# Patient Record
Sex: Male | Born: 2005
Health system: Southern US, Community
[De-identification: ages and names within clinical notes are randomized; demographics above are authoritative.]

## PROBLEM LIST (undated history)

## (undated) DIAGNOSIS — Z889 Allergy status to unspecified drugs, medicaments and biological substances status: Secondary | ICD-10-CM

## (undated) HISTORY — PX: TONSILLECTOMY: SUR1361

## (undated) HISTORY — PX: TYMPANOSTOMY TUBE PLACEMENT: SHX32

---
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
## 2023-03-22 IMAGING — CR DG CHEST 2V
2 series · 2 of 2 positions shown · non-contrast
Comparison: 05/29/2019

CLINICAL DATA: cough, fever, SOB with walking.

EXAM:
CHEST - 2 VIEW

[chest pa]
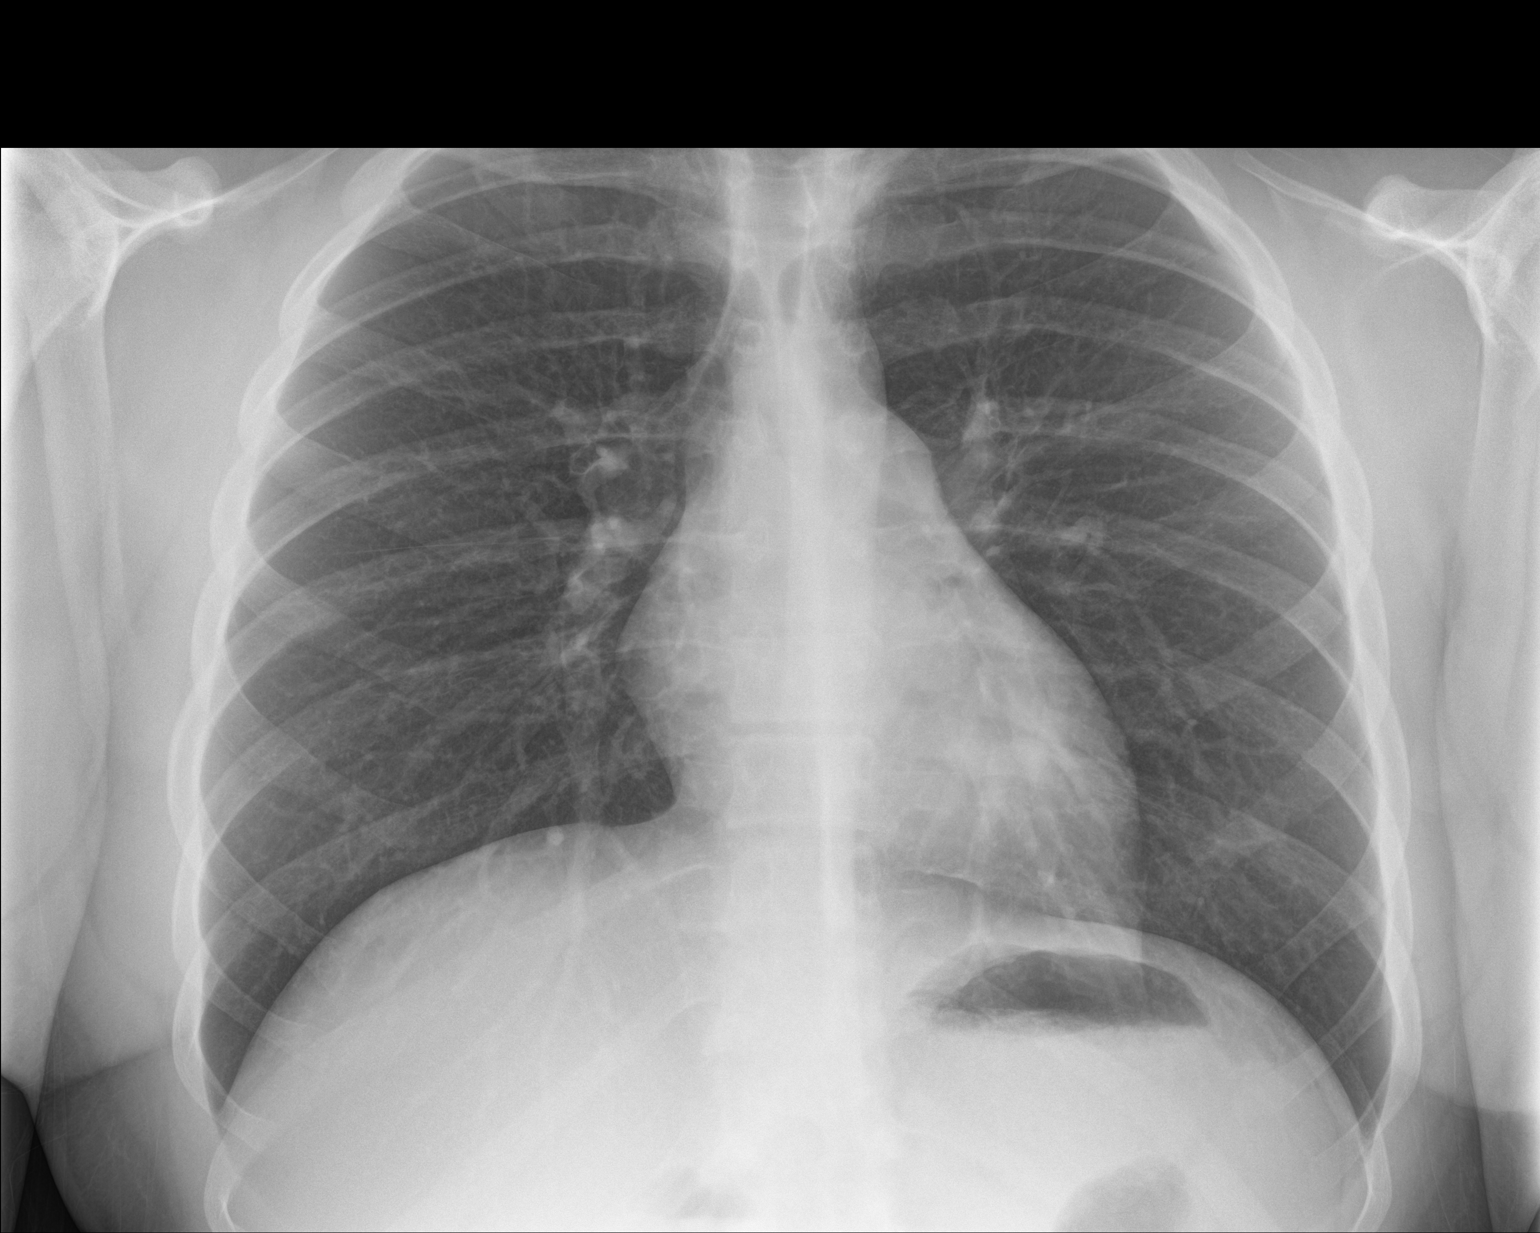

[chest lat]
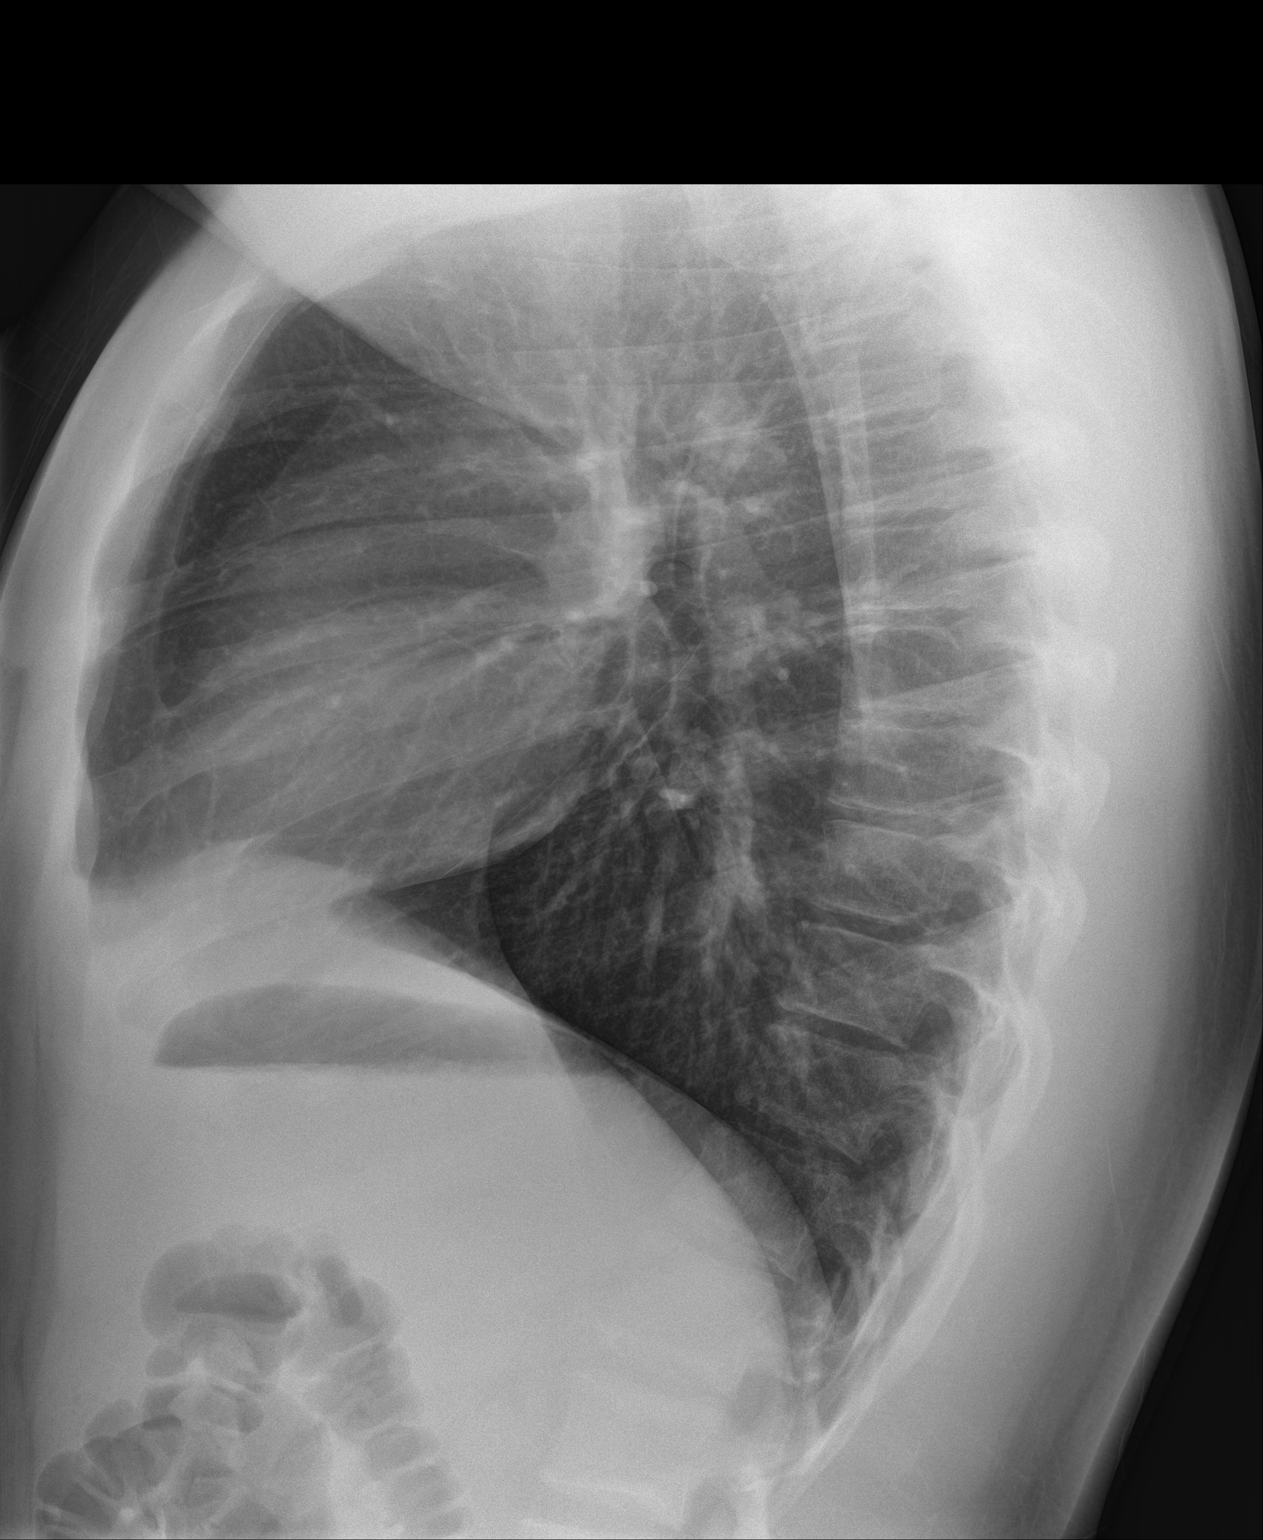

[2 of 2 positions shown; findings below may reference images not displayed]

FINDINGS: The heart size and mediastinal contours are within normal limits.
Both lungs are clear. The visualized skeletal structures are
unremarkable.
IMPRESSION: No active cardiopulmonary disease.

## 2024-06-03 ENCOUNTER — Encounter: Payer: Self-pay | Admitting: Emergency Medicine

## 2024-06-03 ENCOUNTER — Ambulatory Visit
Admission: EM | Admit: 2024-06-03 | Discharge: 2024-06-03 | Disposition: A | Discharge status: Home / Self Care | Attending: Family Medicine | Admitting: Family Medicine

## 2024-06-03 DIAGNOSIS — J029 Acute pharyngitis, unspecified: Secondary | ICD-10-CM | POA: Diagnosis not present

## 2024-06-03 DIAGNOSIS — J111 Influenza due to unidentified influenza virus with other respiratory manifestations: Secondary | ICD-10-CM

## 2024-06-03 LAB — POCT RAPID STREP A (OFFICE): Rapid Strep A Screen: NEGATIVE

## 2024-06-03 LAB — POCT INFLUENZA A/B
Influenza A, POC: NEGATIVE
Influenza B, POC: NEGATIVE

## 2024-06-03 LAB — POC SOFIA SARS ANTIGEN FIA: SARS Coronavirus 2 Ag: NEGATIVE

## 2024-06-03 MED ORDER — BENZONATATE 200 MG PO CAPS: 200.0000 mg | ORAL_CAPSULE | Freq: Three times a day (TID) | ORAL | 0 refills | Status: AC | PRN

## 2024-06-03 MED ORDER — OSELTAMIVIR PHOSPHATE 75 MG PO CAPS: 75.0000 mg | ORAL_CAPSULE | Freq: Two times a day (BID) | ORAL | 0 refills | Status: AC

## 2024-06-03 MED ORDER — ACETAMINOPHEN 325 MG PO TABS
975.0000 mg | ORAL_TABLET | Freq: Once | ORAL | Status: AC
  Administered 2024-06-03: 975 mg via ORAL

## 2024-06-03 NOTE — ED Provider Notes (Signed)
 " [facility]    CSN: [id number] Arrival date & time: 06/03/24  1200      History   Chief Complaint Chief Complaint  Patient presents with   Sore Throat   Fever    HPI Jacob Rush is a 19 y.o. male  presents for evaluation of URI symptoms for 1 days. Patient reports associated symptoms of cough, congestion, headache, fever, fatigue, sore throat, ear pain. Denies N/V/D, shortness of breath. Patient does not have a hx of asthma. Patient is not an active smoker.   Reports no known sick contacts.  Pt has taken Tylenol  last night OTC for symptoms. Pt has no other concerns at this time.    Sore Throat  Fever Associated symptoms: congestion, cough and myalgias     Past Medical History:  Diagnosis Date   History of seasonal allergies     There are no active problems to display for this patient.   Past Surgical History:  Procedure Laterality Date   TONSILLECTOMY     TYMPANOSTOMY TUBE PLACEMENT         Home Medications    Prior to Admission medications  Medication Sig Start Date End Date Taking? Authorizing Provider  benzonatate  (TESSALON ) 200 MG capsule Take 1 capsule (200 mg total) by mouth 3 (three) times daily as needed. 06/03/24  Yes Anniston Nellums, Jodi R, NP  oseltamivir  (TAMIFLU ) 75 MG capsule Take 1 capsule (75 mg total) by mouth every 12 (twelve) hours. 06/03/24  Yes Zanaya Baize, Jodi R, NP  ZEPBOUND 5 MG/0.5ML Pen SMARTSIG:0.5 Milliliter(s) SUB-Q Once a Week 05/31/24  Yes [provider]  ipratropium (ATROVENT ) 0.06 % nasal spray Place 2 sprays into both nostrils 4 (four) times daily. 11/16/22   Bernardino Ditch, NP  promethazine -dextromethorphan (PROMETHAZINE -DM) 6.25-15 MG/5ML syrup Take 5 mLs by mouth 4 (four) times daily as needed. 11/16/22   Bernardino Ditch, NP    Family History Family History  Problem Relation Age of Onset   Diabetes Mother    Hypertension Father    Obesity Father     Social History Social History[1]   Allergies   Patient has  no known allergies.   Review of Systems Review of Systems  Constitutional:  Positive for fatigue and fever.  HENT:  Positive for congestion.   Respiratory:  Positive for cough.   Musculoskeletal:  Positive for myalgias.     Physical Exam Triage Vital Signs ED Triage Vitals  Encounter Vitals Group     BP 06/03/24 1344 92/63     Girls Systolic BP Percentile --      Girls Diastolic BP Percentile --      Boys Systolic BP Percentile --      Boys Diastolic BP Percentile --      Pulse Rate 06/03/24 1344 (!) 130     Resp 06/03/24 1344 18     Temp 06/03/24 1344 (!) 102.8 F (39.3 C)     Temp Source 06/03/24 1344 Oral     SpO2 06/03/24 1344 98 %     Weight 06/03/24 1343 250 lb (113.4 kg)     Height 06/03/24 1343 6' 1 (1.854 m)     Head Circumference --      Peak Flow --      Pain Score 06/03/24 1343 7     Pain Loc --      Pain Education --      Exclude from Growth Chart --    No data found.  Updated Vital Signs BP  115/78 (BP Location: Right Arm)   Pulse (!) 130   Temp (!) 102.8 F (39.3 C) (Oral)   Resp 18   Ht 6' 1 (1.854 m)   Wt 250 lb (113.4 kg)   SpO2 98%   BMI 32.98 kg/m   Visual Acuity Right Eye Distance:   Left Eye Distance:   Bilateral Distance:    Right Eye Near:   Left Eye Near:    Bilateral Near:     Physical Exam Vitals and nursing note reviewed.  Constitutional:      General: He is not in acute distress.    Appearance: Normal appearance. He is not ill-appearing or toxic-appearing.  HENT:     Head: Normocephalic and atraumatic.     Right Ear: Tympanic membrane and ear canal normal.     Left Ear: Tympanic membrane and ear canal normal.     Nose: Congestion present.     Mouth/Throat:     Mouth: Mucous membranes are moist.     Pharynx: Posterior oropharyngeal erythema present.  Eyes:     Pupils: Pupils are equal, round, and reactive to light.  Cardiovascular:     Rate and Rhythm: Normal rate and regular rhythm.     Heart sounds: Normal  heart sounds.  Pulmonary:     Effort: Pulmonary effort is normal.     Breath sounds: Normal breath sounds. No wheezing, rhonchi or rales.  Musculoskeletal:     Cervical back: Normal range of motion and neck supple.  Lymphadenopathy:     Cervical: No cervical adenopathy.  Skin:    General: Skin is warm and dry.  Neurological:     General: No focal deficit present.     Mental Status: He is alert and oriented to person, place, and time.  Psychiatric:        Mood and Affect: Mood normal.        Behavior: Behavior normal.      UC Treatments / Results  Labs (all labs ordered are listed, but only abnormal results are displayed) Labs Reviewed  POCT INFLUENZA A/B - Normal  POCT RAPID STREP A (OFFICE) - Normal  POC SOFIA SARS ANTIGEN FIA - Normal    EKG   Radiology No results found.  Procedures Procedures (including critical care time)  Medications Ordered in UC Medications  acetaminophen  (TYLENOL ) tablet 975 mg (975 mg Oral Given 06/03/24 1400)    Initial Impression / Assessment and Plan / UC Course  I have reviewed the triage vital signs and the nursing notes.  Pertinent labs & imaging results that were available during my care of the patient were reviewed by me and considered in my medical decision making (see chart for details).  Clinical Course as of 06/03/24 1422  Fri Jun 03, 2024  1415 Temp recheck 101.9 oral [JM]    Clinical Course User Index [JM] Loreda Myla SAUNDERS, NP    Reviewed exam and symptoms with patient.  Negative COVID flu and strep testing.  Symptom onset was less than 24 hours.  Given presentation and fever discussed likely influenza.  Will do Tamiflu  and Tessalon  as needed.  He was given Tylenol  in clinic with fever with improvement.  Encouraged rest fluids and PCP follow-up 2 to 3 days for recheck.  ER precautions reviewed Final Clinical Impressions(s) / UC Diagnoses   Final diagnoses:  Sore throat  Influenza-like illness     Discharge  Instructions      Start Tamiflu  antiviral medication.  This does not  make the flu go away but helps reduce chance of complication and severity of symptoms.  You may take Tylenol  and ibuprofen  as needed for your fever.  You were given Tylenol  in clinic.  Tessalon  as needed for your cough.  Lots of rest and fluids.  Follow-up with your PCP in 2 to 3 days for recheck.  Please go to the ER for any worsening symptoms.  Hope you feel better soon!    ED Prescriptions     Medication Sig Dispense Auth. Provider   benzonatate  (TESSALON ) 200 MG capsule Take 1 capsule (200 mg total) by mouth 3 (three) times daily as needed. 20 capsule Odis Turck, Jodi R, NP   oseltamivir  (TAMIFLU ) 75 MG capsule Take 1 capsule (75 mg total) by mouth every 12 (twelve) hours. 10 capsule Sharea Guinther, Jodi R, NP      PDMP not reviewed this encounter.     [1]  Social History Tobacco Use   Smoking status: Never   Smokeless tobacco: Never  Vaping Use   Vaping status: Never Used  Substance Use Topics   Alcohol use: Never   Drug use: Never     Loreda Myla SAUNDERS, NP 06/03/24 1422  "

## 2024-06-03 NOTE — ED Triage Notes (Signed)
 Pt c/o bilateral ear pain, fever, dizziness, weakness, cough, headache. Started last night.

## 2024-06-03 NOTE — Discharge Instructions (Signed)
 Start Tamiflu  antiviral medication.  This does not make the flu go away but helps reduce chance of complication and severity of symptoms.  You may take Tylenol  and ibuprofen  as needed for your fever.  You were given Tylenol  in clinic.  Tessalon  as needed for your cough.  Lots of rest and fluids.  Follow-up with your PCP in 2 to 3 days for recheck.  Please go to the ER for any worsening symptoms.  Hope you feel better soon!
